# Patient Record
Sex: Male | Born: 1995 | Race: Black or African American | Hispanic: No | Marital: Single | Smoking: Current some day smoker
Health system: Southern US, Community
[De-identification: ages and names within clinical notes are randomized; demographics above are authoritative.]

---

## 2019-11-03 ENCOUNTER — Ambulatory Visit: Payer: Self-pay | Admitting: Registered Nurse

## 2021-02-18 ENCOUNTER — Other Ambulatory Visit: Payer: Self-pay

## 2021-02-18 ENCOUNTER — Emergency Department (HOSPITAL_BASED_OUTPATIENT_CLINIC_OR_DEPARTMENT_OTHER)
Admission: EM | Admit: 2021-02-18 | Discharge: 2021-02-18 | Disposition: A | Discharge status: Home / Self Care | Payer: BC Managed Care – PPO | Attending: Emergency Medicine | Admitting: Emergency Medicine

## 2021-02-18 ENCOUNTER — Other Ambulatory Visit (HOSPITAL_BASED_OUTPATIENT_CLINIC_OR_DEPARTMENT_OTHER): Payer: Self-pay

## 2021-02-18 ENCOUNTER — Encounter (HOSPITAL_BASED_OUTPATIENT_CLINIC_OR_DEPARTMENT_OTHER): Payer: Self-pay | Admitting: *Deleted

## 2021-02-18 ENCOUNTER — Emergency Department (HOSPITAL_BASED_OUTPATIENT_CLINIC_OR_DEPARTMENT_OTHER): Payer: BC Managed Care – PPO

## 2021-02-18 DIAGNOSIS — Y92410 Unspecified street and highway as the place of occurrence of the external cause: Secondary | ICD-10-CM | POA: Diagnosis not present

## 2021-02-18 DIAGNOSIS — S61411A Laceration without foreign body of right hand, initial encounter: Secondary | ICD-10-CM | POA: Insufficient documentation

## 2021-02-18 DIAGNOSIS — F17210 Nicotine dependence, cigarettes, uncomplicated: Secondary | ICD-10-CM | POA: Insufficient documentation

## 2021-02-18 DIAGNOSIS — S0990XA Unspecified injury of head, initial encounter: Secondary | ICD-10-CM | POA: Diagnosis not present

## 2021-02-18 DIAGNOSIS — S62612B Displaced fracture of proximal phalanx of right middle finger, initial encounter for open fracture: Secondary | ICD-10-CM | POA: Diagnosis not present

## 2021-02-18 DIAGNOSIS — S6991XA Unspecified injury of right wrist, hand and finger(s), initial encounter: Secondary | ICD-10-CM | POA: Diagnosis present

## 2021-02-18 MED ORDER — CEPHALEXIN 500 MG PO CAPS
500.0000 mg | ORAL_CAPSULE | Freq: Four times a day (QID) | ORAL | 0 refills | Status: AC
  Filled 2021-02-18: qty 20, 5d supply, fill #0

## 2021-02-18 MED ORDER — LIDOCAINE-EPINEPHRINE (PF) 2 %-1:200000 IJ SOLN
10.0000 mL | Freq: Once | INTRAMUSCULAR | Status: AC
  Administered 2021-02-18: 10 mL
  Filled 2021-02-18: qty 20
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
## 2021-02-18 NOTE — ED Notes (Signed)
ED Provider at bedside for sutures. 

## 2021-02-18 NOTE — Discharge Instructions (Signed)
Leave the bandages on until tomorrow.  Then you can remove the bandage.  You can wash with soap and water but avoid scrubbing or soaking.  Wear the splint on your finger for the next 1 to 2 weeks.  You should be able to pull the sutures out in 7 to 10 days.  You can place a little bit of Vaseline over the wound to prevent bandages from sticking.

## 2021-02-18 NOTE — ED Notes (Signed)
Bleeding controlled of laceration to palmar side of right hand by 4th digit,  Laceration edges jagged, lac approx 1.5 cm in length.  Pt denies any pain at this time. Pressure bandage in place

## 2021-02-25 ENCOUNTER — Ambulatory Visit (HOSPITAL_BASED_OUTPATIENT_CLINIC_OR_DEPARTMENT_OTHER)
Admission: RE | Admit: 2021-02-25 | Discharge: 2021-02-25 | Disposition: A | Discharge status: Home / Self Care | Payer: BC Managed Care – PPO | Source: Ambulatory Visit | Attending: Family Medicine | Admitting: Family Medicine

## 2021-02-25 ENCOUNTER — Other Ambulatory Visit: Payer: Self-pay

## 2021-02-25 ENCOUNTER — Ambulatory Visit (INDEPENDENT_AMBULATORY_CARE_PROVIDER_SITE_OTHER): Payer: BC Managed Care – PPO | Admitting: Family Medicine

## 2021-02-25 VITALS — BP 110/60 | Ht 76.0 in | Wt 180.0 lb

## 2021-02-25 DIAGNOSIS — S62619A Displaced fracture of proximal phalanx of unspecified finger, initial encounter for closed fracture: Secondary | ICD-10-CM | POA: Insufficient documentation
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
## 2021-02-25 NOTE — Patient Instructions (Signed)
Nice to meet you Please continue the splint  Please try the range of motion movements  I will call with the results  Please send me a message in MyChart with any questions or updates.  Please see me back in 2 weeks.   --Dr. Jordan Likes

## 2021-03-01 ENCOUNTER — Telehealth: Payer: Self-pay | Admitting: Family Medicine

## 2021-03-01 NOTE — Telephone Encounter (Signed)
Left VM for patient. If he calls back please have him speak with a nurse/CMA and inform that his xray is not showing healing yet. Continue with current plan and follow up.   If any questions then please take the best time and phone number to call and I will try to call him back.   Myra Rude, MD [facility] 03/01/2021, 9:12 AM

## 2021-03-07 NOTE — Telephone Encounter (Signed)
Result letter printed and mailed to pt.

## 2021-03-11 ENCOUNTER — Encounter: Payer: Self-pay | Admitting: Family Medicine

## 2021-03-11 ENCOUNTER — Other Ambulatory Visit: Payer: Self-pay

## 2021-03-11 ENCOUNTER — Ambulatory Visit (HOSPITAL_BASED_OUTPATIENT_CLINIC_OR_DEPARTMENT_OTHER)
Admission: RE | Admit: 2021-03-11 | Discharge: 2021-03-11 | Disposition: A | Discharge status: Home / Self Care | Payer: BC Managed Care – PPO | Source: Ambulatory Visit | Attending: Family Medicine | Admitting: Family Medicine

## 2021-03-11 ENCOUNTER — Ambulatory Visit (INDEPENDENT_AMBULATORY_CARE_PROVIDER_SITE_OTHER): Payer: BC Managed Care – PPO | Admitting: Family Medicine

## 2021-03-11 DIAGNOSIS — S62619D Displaced fracture of proximal phalanx of unspecified finger, subsequent encounter for fracture with routine healing: Secondary | ICD-10-CM | POA: Diagnosis present
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
## 2021-03-11 NOTE — Patient Instructions (Signed)
Good to see you Continue buddy taping for the next 3 weeks.  I will call with the results from today   Please send me a message in MyChart with any questions or updates.  Please see [place] back as needed.   --Dr. Jordan Likes

## 2021-03-15 ENCOUNTER — Telehealth: Payer: Self-pay | Admitting: Family Medicine

## 2021-03-15 NOTE — Telephone Encounter (Signed)
Informed of results.   Myra Rude, MD [facility] 03/15/2021, 9:12 AM

## 2022-08-27 IMAGING — DX DG HAND COMPLETE 3+V*R*
3 series · 3 of 3 positions shown · non-contrast
Comparison: February 18, 2021

CLINICAL DATA: Follow-up third finger fracture

EXAM:
RIGHT HAND - COMPLETE 3+ VIEW

[hand pa]
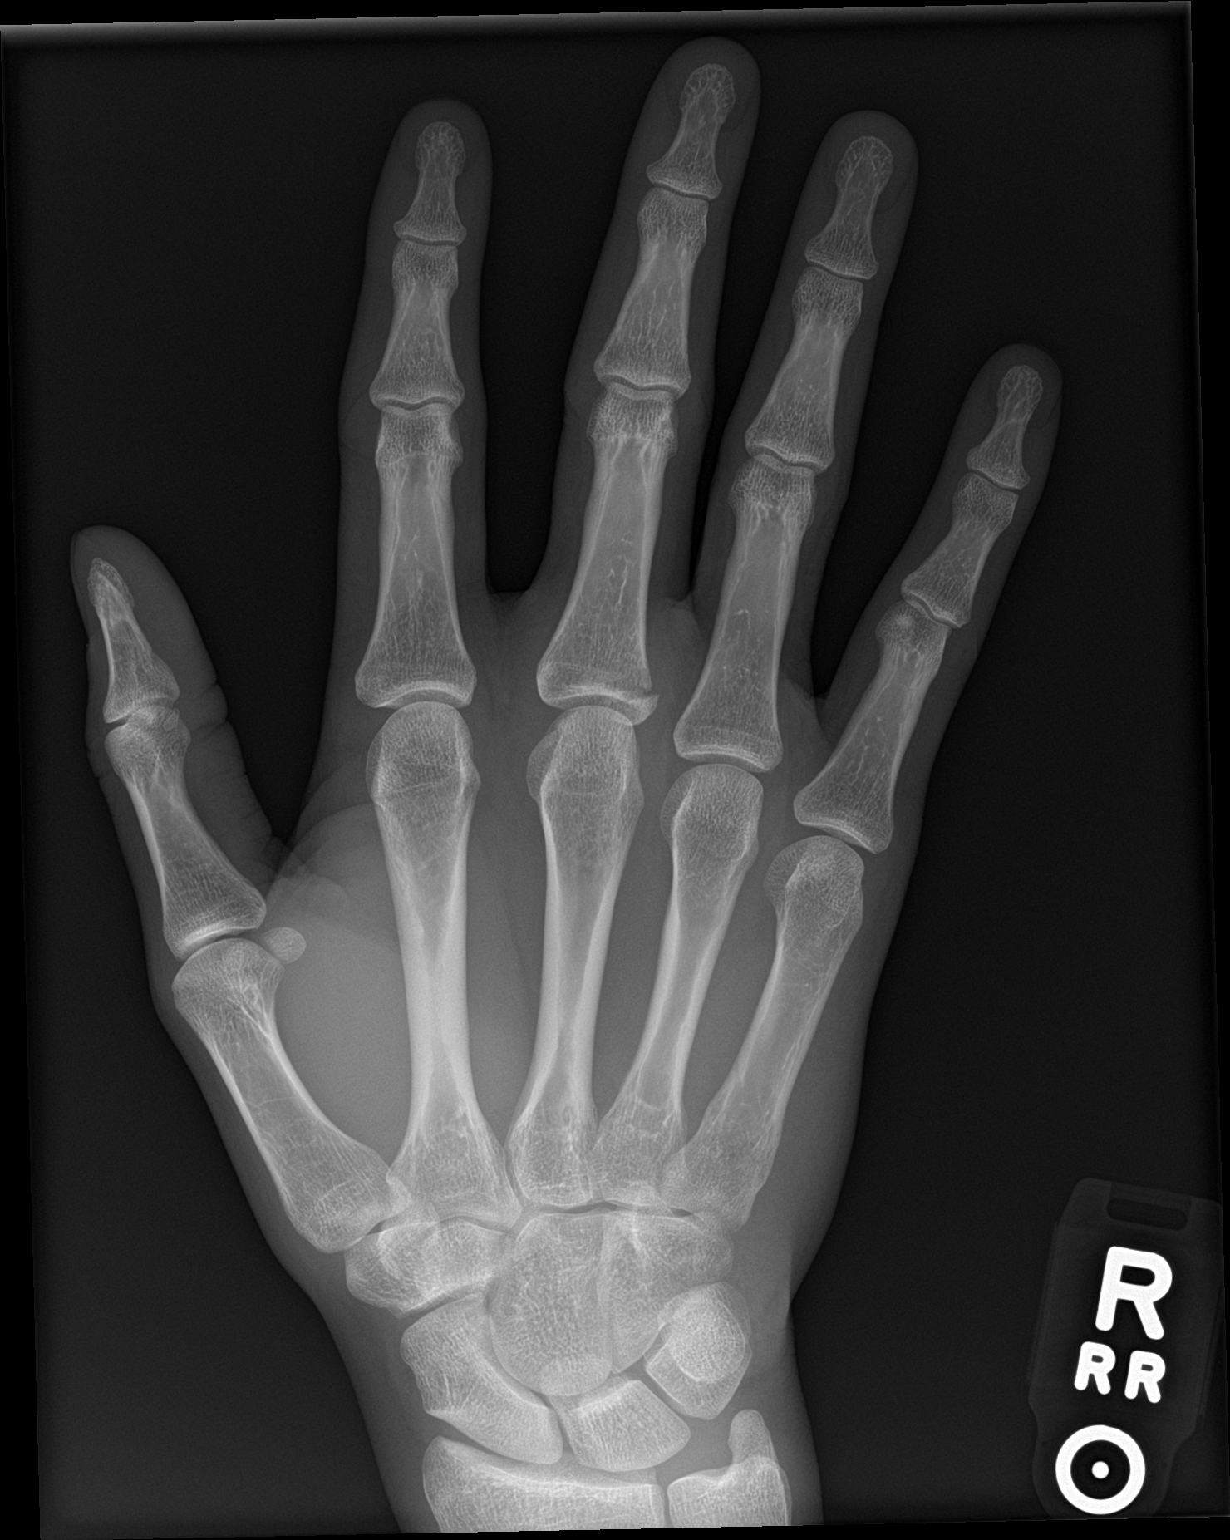

[hand obl]
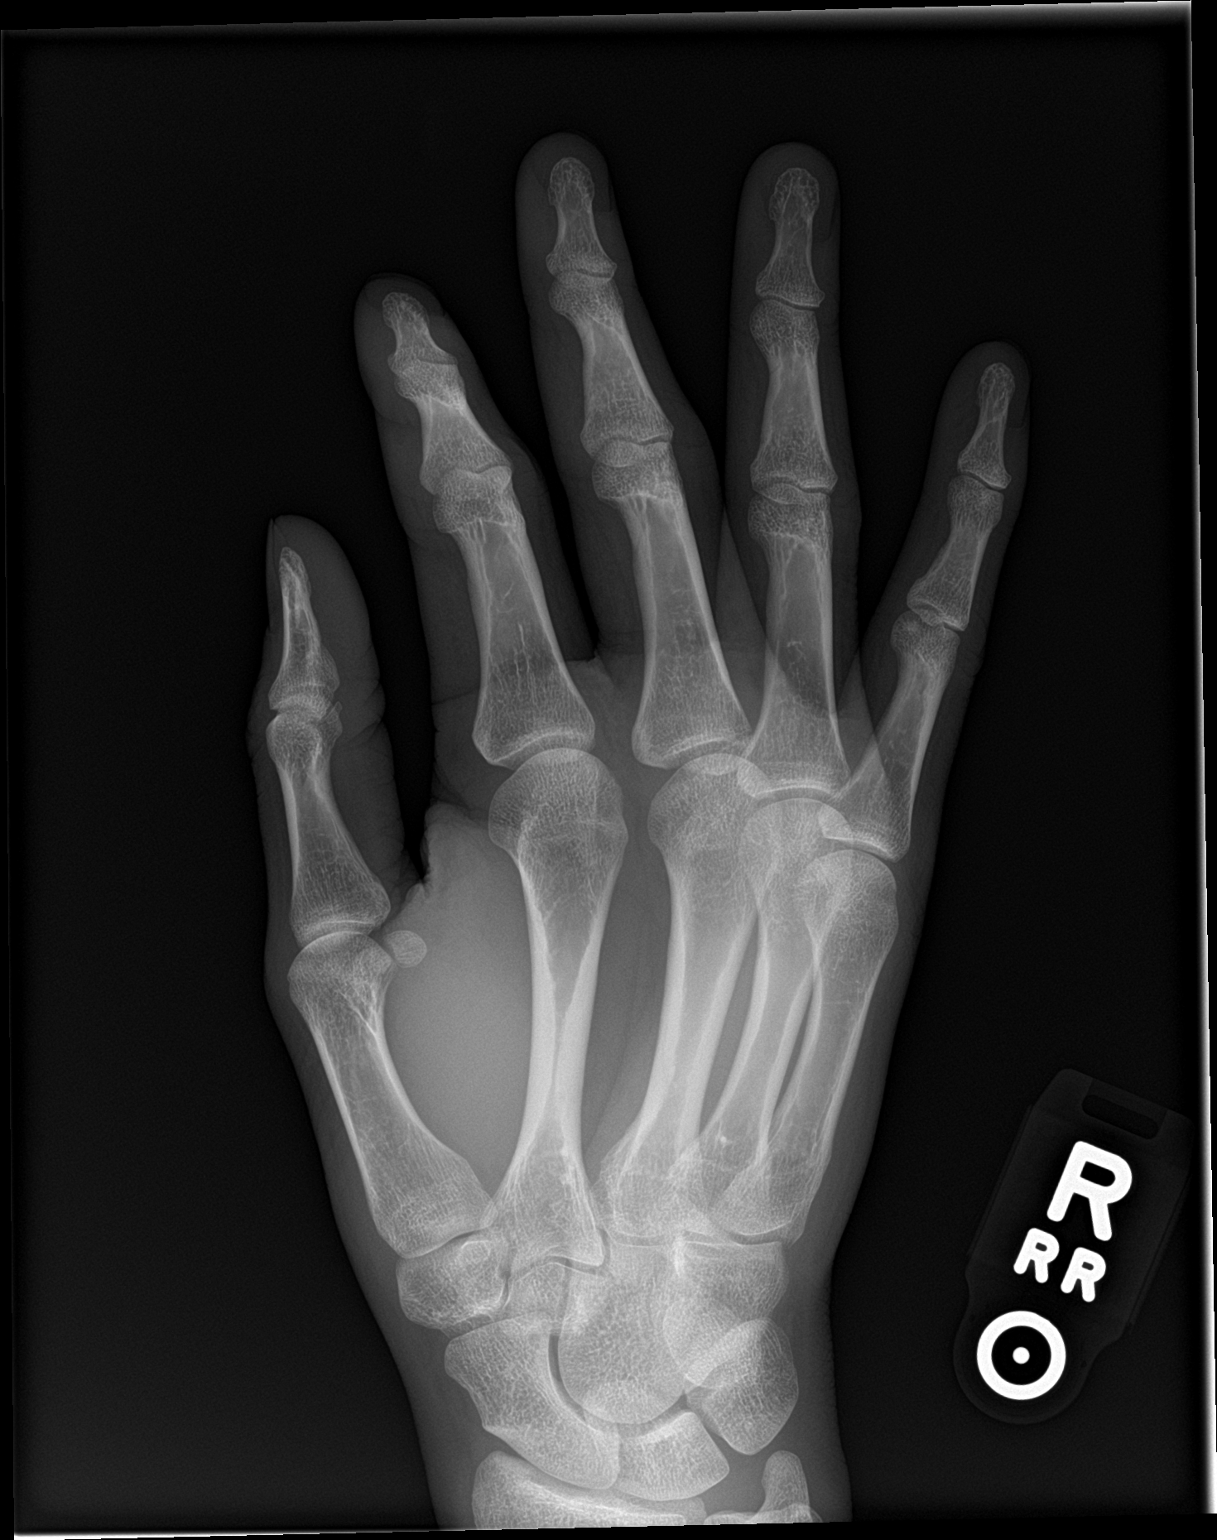

[hand lat]
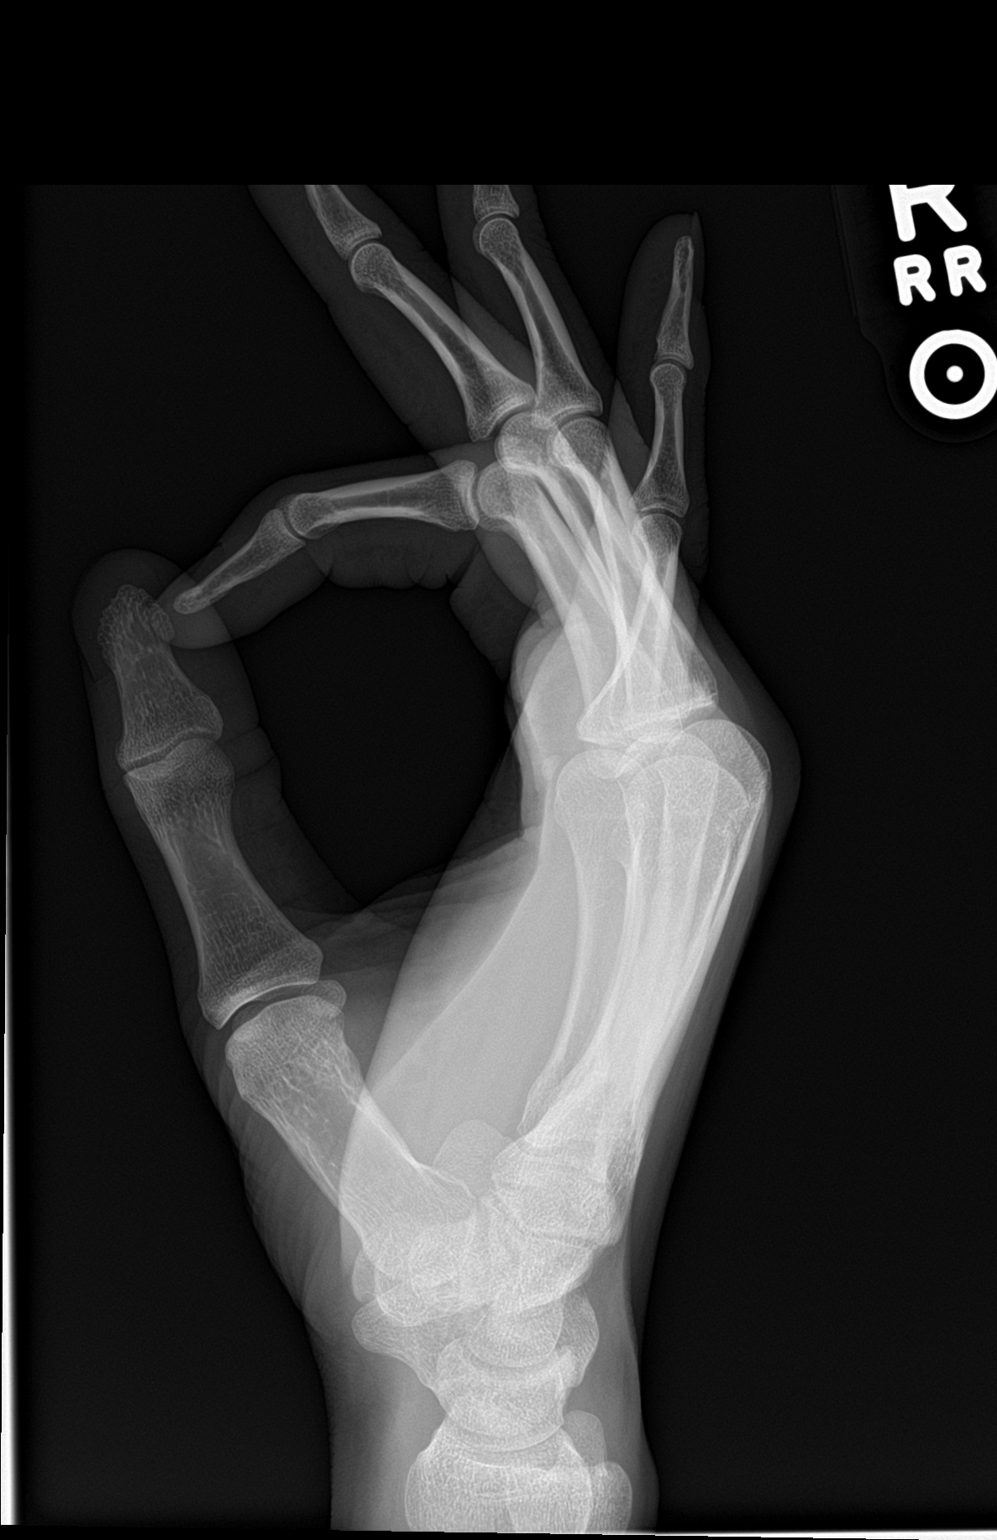

[3 of 3 positions shown; findings below may reference images not displayed]

FINDINGS: Revisualization of a nondisplaced fracture of the ulnar aspect of
the base of the third proximal phalanx. Revisualization of
intra-articular extension. Fracture fragments are in unchanged
alignment. No additional acute fracture or dislocation. No
unexpected radiopaque foreign body.
IMPRESSION: Unchanged appearance of a fracture of the ulnar aspect of the base
of the third proximal phalanx.

## 2023-01-31 ENCOUNTER — Encounter: Payer: Self-pay | Admitting: *Deleted
